# Patient Record
Sex: Male | Born: 1995 | Race: Black or African American | Hispanic: No | Marital: Single | Smoking: Never smoker
Health system: Southern US, Community
[De-identification: ages and names within clinical notes are randomized; demographics above are authoritative.]

## PROBLEM LIST (undated history)

## (undated) DIAGNOSIS — J45909 Unspecified asthma, uncomplicated: Secondary | ICD-10-CM

## (undated) DIAGNOSIS — R6250 Unspecified lack of expected normal physiological development in childhood: Secondary | ICD-10-CM

## (undated) DIAGNOSIS — Z9109 Other allergy status, other than to drugs and biological substances: Secondary | ICD-10-CM

## (undated) DIAGNOSIS — H9190 Unspecified hearing loss, unspecified ear: Secondary | ICD-10-CM

## (undated) DIAGNOSIS — R569 Unspecified convulsions: Secondary | ICD-10-CM

## (undated) HISTORY — PX: OTHER SURGICAL HISTORY: SHX169

## (undated) HISTORY — DX: Unspecified convulsions: R56.9

## (undated) HISTORY — PX: MOUTH SURGERY: SHX715

---
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
## 2018-05-30 HISTORY — PX: COCHLEAR IMPLANT: SUR684

## 2019-04-24 ENCOUNTER — Other Ambulatory Visit: Payer: Self-pay

## 2019-04-24 DIAGNOSIS — Z20822 Contact with and (suspected) exposure to covid-19: Secondary | ICD-10-CM

## 2019-04-26 ENCOUNTER — Telehealth: Payer: Self-pay | Admitting: General Practice

## 2019-04-26 LAB — NOVEL CORONAVIRUS, NAA: SARS-CoV-2, NAA: NOT DETECTED

## 2019-04-26 NOTE — Telephone Encounter (Signed)
Gave patient negative covid test results Patient understood 

## 2019-08-06 ENCOUNTER — Ambulatory Visit (INDEPENDENT_AMBULATORY_CARE_PROVIDER_SITE_OTHER): Payer: Self-pay

## 2019-08-06 ENCOUNTER — Encounter (HOSPITAL_COMMUNITY): Payer: Self-pay

## 2019-08-06 ENCOUNTER — Ambulatory Visit (HOSPITAL_COMMUNITY)
Admission: EM | Admit: 2019-08-06 | Discharge: 2019-08-06 | Disposition: A | Discharge status: Home / Self Care | Payer: Self-pay | Attending: Physician Assistant | Admitting: Physician Assistant

## 2019-08-06 ENCOUNTER — Other Ambulatory Visit: Payer: Self-pay

## 2019-08-06 DIAGNOSIS — R2689 Other abnormalities of gait and mobility: Secondary | ICD-10-CM

## 2019-08-06 DIAGNOSIS — M79672 Pain in left foot: Secondary | ICD-10-CM

## 2019-08-06 MED ORDER — NAPROXEN 500 MG PO TABS: 500.0000 mg | ORAL_TABLET | Freq: Two times a day (BID) | ORAL | 0 refills | Status: DC

## 2019-08-06 NOTE — ED Triage Notes (Signed)
Pt's mother states pt has been dragging feet; walking differently; and demonstrated that he did not want top of left foot touched. Pt non-verbal. Mom denies any known injury to area; states "he has fluid on both his feet and legs".

## 2019-08-06 NOTE — ED Provider Notes (Addendum)
[facility]    CSN: [id number] Arrival date & time: 08/06/19  1696      History   Chief Complaint Chief Complaint  Patient presents with  . Foot Pain    HPI Ruben Murphy is a 24 y.o. male.   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  Patient is nonverbal and has minimal understanding of sign language.  Mom reports that the way Ruben Murphy signals pain is that he will point to what hurts.  Mom reports he has had swelling in both of his ankles for over 1 month.  She reports that this is also been present in the past and gets better when he is more active.  She denies that this is been getting worse.  She reports over the last 3 to 4 days she has noticed that [facility] has began shuffling his left foot.  Mom states to her knowledge there has been no injury or falls.  She reports otherwise he has been his normal self.  Denies fever and chills.  Mom denies that 2 months he has signaled for pain elsewhere to include hip or knee.   Chart review shows that patient had swelling in his ankles in 2014.  Mom also believes that he had had ankle pain at that time.  He had imaging of the left foot done as well without significant findings.     Past Medical History:  Diagnosis Date  . Asthma   . Deaf   . Developmental delay   . Environmental allergies   . Seizures [facility])     Patient Active Problem List   Diagnosis Date Noted  . Generalized nonconvulsive epilepsy without mention of intractable epilepsy 01/04/2013  . Transient alteration of awareness 01/04/2013  . Morbid obesity (HCC) 01/04/2013  . 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  . Delayed milestones 01/04/2013  . Other specified forms of hearing loss 01/04/2013  . Microcephalus (HCC) 01/04/2013  . Tics of organic origin 01/04/2013    . 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 Congenital syphilis, unspecified 01/04/2013  . 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 Generalized convulsive epilepsy without mention of intractable epilepsy 01/04/2013  . Localization-related (focal) (partial) epilepsy and epileptic syndromes with complex partial seizures, without mention of intractable epilepsy 01/04/2013    Past Surgical History:  Procedure Laterality Date  . MOUTH SURGERY    . tubes in ears         Home Medications    Prior to Admission medications   Medication Sig Start Date End Date Taking? Authorizing Provider  albuterol (PROVENTIL HFA;VENTOLIN HFA) 108 (90 BASE) MCG/ACT inhaler Inhale 2 puffs into the lungs every 6 (six) hours as needed. For shortness of breath    [provider]  cetirizine (ZYRTEC) 10 MG tablet Take 10 mg by mouth at bedtime.    [provider]  Fluticasone-Salmeterol (ADVAIR) 250-50 MCG/DOSE AEPB Inhale 1 puff into the lungs every 12 (twelve) hours.    [provider]  Montelukast Sodium (SINGULAIR PO) Take 1 tablet by mouth daily.    [provider]  naproxen (NAPROSYN) 500 MG tablet Take 1 tablet (500 mg total) by mouth 2 (two) times daily. 08/06/19   Bennye Nix, Veryl Speak, PA-C    Family History History reviewed. No pertinent family history.  Social History Social History   Tobacco Use  .  Smoking status: Never Smoker  Substance Use Topics  . Alcohol use: No  . Drug use: No     Allergies   Patient has no known allergies.   Review of Systems Review of Systems  Constitutional: Negative for activity change, appetite change, chills, fatigue and fever.  Musculoskeletal: Positive for gait problem and joint swelling. Negative for myalgias.  Skin: Negative for color change and rash.  Neurological: Positive for speech difficulty.     Physical Exam Triage Vital Signs ED Triage Vitals [08/06/19 1013]  Enc Vitals Group     BP 124/79      Pulse Rate 74     Resp 18     Temp 97.9 F (36.6 C)     Temp src      SpO2 100 %     Weight      Height      Head Circumference      Peak Flow      Pain Score      Pain Loc      Pain Edu?      Excl. in [place]?    No data found.  Updated Vital Signs BP 124/79 (BP Location: Left Arm)   Pulse 74   Temp 97.9 F (36.6 C)   Resp 18   SpO2 100%   Visual Acuity Right Eye Distance:   Left Eye Distance:   Bilateral Distance:    Right Eye Near:   Left Eye Near:    Bilateral Near:     Physical Exam Vitals and nursing note reviewed.  Constitutional:      Appearance: He is well-developed. He is obese.     Comments: Patient is nonverbal and congenitally deaf.  Able to understand basic written language and basic signs.  He follows basic hand signal commands  HENT:     Head: Normocephalic and atraumatic.  Eyes:     Extraocular Movements: Extraocular movements intact.     Conjunctiva/sclera: Conjunctivae normal.     Pupils: Pupils are equal, round, and reactive to light.     Comments: Tracking well across the room  Cardiovascular:     Rate and Rhythm: Normal rate and regular rhythm.     Heart sounds: No murmur.  Pulmonary:     Effort: Pulmonary effort is normal. No respiratory distress.     Breath sounds: Normal breath sounds.  Musculoskeletal:     Cervical back: Neck supple.     Comments: Bilateral ankle swelling that is not pitting.  Patient signals pain with manipulation of left ankle joint.  Signal for pain with palpation of the dorsum of the foot.  Joint is not erythematous and not warm.  Right ankle exam  without  pain signaled  Skin:    General: Skin is warm and dry.     Capillary Refill: Capillary refill takes less than 2 seconds.     Findings: No erythema or rash.  Neurological:     Mental Status: He is alert.     Comments: Patient ambulatory without issue.  There is dragging of the left foot with shuffling at times.  This is not present on every left stride.  No  issues with right sided gait.  Neurologic examination is very limited due to nonverbal status and severe developmental delays.  Overall however patient is moving all limbs well, is alert and interactive during exam.      UC Treatments / Results  Labs (all labs ordered are listed, but only abnormal results are  displayed) Labs Reviewed - No data to display  EKG   Radiology DG Foot Complete Left  Result Date: 08/06/2019 CLINICAL DATA:  Dorsal left foot pain with change in gait. EXAM: LEFT FOOT - COMPLETE 3+ VIEW COMPARISON:  11/14/2011 FINDINGS: There is no evidence of fracture or dislocation. There is suggestion of flatfoot deformity but this could be from obliquity as the patient is not visibly weight-bearing. Medial navicular protuberance without sclerosis. There is no evidence of arthropathy or other focal bone abnormality. Soft tissues are unremarkable. IMPRESSION: No acute finding. Electronically Signed   By: Marnee Spring M.D.   On: 08/06/2019 11:14    Procedures Procedures (including critical care time)  Medications Ordered in UC Medications - No data to display  Initial Impression / Assessment and Plan / UC Course  I have reviewed the triage vital signs and the nursing notes.  Pertinent labs & imaging results that were available during my care of the patient were reviewed by me and considered in my medical decision making (see chart for details).     #Left foot pain #Shuffling gait Patient is a 24 year old with congenital deafness with severe developmental delays secondary to congenital syphilis and congenital CMV infection.  X-ray of left foot without arthropathy or bony abnormality.   Patient appears neurologically well and has been at baseline mental status according to mom.  Limited neurologic exam due to developmental delay and limited communication.  Ambulating well with the exception of shuffling of the left foot at times.  Concern of possible foot drop as an  etiology.  This could also be a pain response.  The swelling in his ankles is not edema and is isolated to the ankles.  Patient had similar presentations in 2013 and 2014 when chart review with a benign work-up at that time.  This was attributed to body habitus at the time.  Given chronicity and mostly benign exam today there is no acute indication for more work-up at this time.  Discussed with mom that follow-up with orthopedics is advised and supplied her with options for primary care in the area.  Discussed that if gait becomes worse or he has changes in mental status that he should be taken to the emergency department.  -Mom states understanding and agrees with the plan will follow up accordingly -Instructed mom that naproxen 2 times a day would be fine.  Final Clinical Impressions(s) / UC Diagnoses   Final diagnoses:  Shuffling gait  Left foot pain     Discharge Instructions     The x-ray of Ruben Murphy's foot was negative for any fractures or abnormalities.  It does appear that he has flat feet though.  I do not have a great explanation for his change in gait at this time.  I would like for you to follow-up with the orthopedic office supplied.  If you notice significant changes in his gait continuing, worsening pain, redness or worsening swelling of the ankle or change in his mental status or becomes more sleepy I would like for you to take him to the emergency department for evaluation.  You may continue ibuprofen or naproxen.      ED Prescriptions    Medication Sig Dispense Auth. Provider   naproxen (NAPROSYN) 500 MG tablet Take 1 tablet (500 mg total) by mouth 2 (two) times daily. 30 tablet Glyn Gerads, Veryl Speak, PA-C     PDMP not reviewed this encounter.   Hermelinda Medicus, PA-C 08/06/19 1144    Jermika Olden, Veryl Speak,  PA-C 08/06/19 1146

## 2019-08-06 NOTE — Discharge Instructions (Addendum)
The x-ray of Ruben Murphy's foot was negative for any fractures or abnormalities.  It does appear that he has flat feet though.  I do not have a great explanation for his change in gait at this time.  I would like for you to follow-up with the orthopedic office supplied.  If you notice significant changes in his gait continuing, worsening pain, redness or worsening swelling of the ankle or change in his mental status or becomes more sleepy I would like for you to take him to the emergency department for evaluation.  You may continue ibuprofen or naproxen.

## 2020-01-28 ENCOUNTER — Other Ambulatory Visit: Payer: Self-pay

## 2020-01-28 ENCOUNTER — Ambulatory Visit (HOSPITAL_COMMUNITY)
Admission: EM | Admit: 2020-01-28 | Discharge: 2020-01-28 | Disposition: A | Discharge status: Home / Self Care | Payer: Medicaid Other | Attending: Family Medicine | Admitting: Family Medicine

## 2020-01-28 ENCOUNTER — Encounter (HOSPITAL_COMMUNITY): Payer: Self-pay

## 2020-01-28 DIAGNOSIS — R630 Anorexia: Secondary | ICD-10-CM

## 2020-01-28 LAB — POCT URINALYSIS DIPSTICK, ED / UC
Glucose, UA: NEGATIVE mg/dL
Hgb urine dipstick: NEGATIVE
Ketones, ur: NEGATIVE mg/dL
Leukocytes,Ua: NEGATIVE
Nitrite: NEGATIVE
Protein, ur: 30 mg/dL — AB
Specific Gravity, Urine: 1.025 (ref 1.005–1.030)
Urobilinogen, UA: 4 mg/dL — ABNORMAL HIGH (ref 0.0–1.0)
pH: 7 (ref 5.0–8.0)

## 2020-01-28 NOTE — ED Provider Notes (Signed)
[facility]    CSN: [id number] Arrival date & time: 01/28/20  1319      History   Chief Complaint Chief Complaint  Patient presents with  . LOOS OF APPETITE    HPI Ruben Murphy is a 24 y.o. male.   Here today with mom who provides all history as patient is deaf and has developmental delays that prohibit him from providing history independently. About 2 weeks of decreased appetite, decreased BMs and the past 2 days mom noticed him clutching the right side of his abdomen here and there. COVID test initially when sxs began were negative. Denies fever, sweats, vomiting, sick contacts, rashes, new medications or supplements. Mother states she is working on getting a primary care provider established in the area for ongoing management as they have been without one for over a year since moving here.      Past Medical History:  Diagnosis Date  . Asthma   . Deaf   . Developmental delay   . Environmental allergies   . Seizures [facility])     Patient Active Problem List   Diagnosis Date Noted  . Generalized nonconvulsive epilepsy without mention of intractable epilepsy 01/04/2013  . Transient alteration of awareness 01/04/2013  . Morbid obesity (HCC) 01/04/2013  . 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  . Delayed milestones 01/04/2013  . Other specified forms of hearing loss 01/04/2013  . Microcephalus (HCC) 01/04/2013  . Tics of organic origin 01/04/2013  . 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 Congenital syphilis, unspecified 01/04/2013  . 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 Generalized convulsive epilepsy without mention of intractable epilepsy 01/04/2013  . Localization-related (focal) (partial) epilepsy and epileptic syndromes with complex partial seizures, without mention of intractable epilepsy 01/04/2013    Past Surgical History:  Procedure Laterality Date  . MOUTH SURGERY    . tubes in ears         Home Medications    Prior to  Admission medications   Medication Sig Start Date End Date Taking? Authorizing Provider  albuterol (PROVENTIL HFA;VENTOLIN HFA) 108 (90 BASE) MCG/ACT inhaler Inhale 2 puffs into the lungs every 6 (six) hours as needed. For shortness of breath    [provider]  cetirizine (ZYRTEC) 10 MG tablet Take 10 mg by mouth at bedtime.    [provider]  Fluticasone-Salmeterol (ADVAIR) 250-50 MCG/DOSE AEPB Inhale 1 puff into the lungs every 12 (twelve) hours.    [provider]  Montelukast Sodium (SINGULAIR PO) Take 1 tablet by mouth daily.    [provider]  naproxen (NAPROSYN) 500 MG tablet Take 1 tablet (500 mg total) by mouth 2 (two) times daily. 08/06/19   Darr, Veryl Speak, PA-C    Family History Family History  Problem Relation Age of Onset  . Hypertension Mother   . Obesity Mother   . Healthy Father     Social History Social History   Tobacco Use  . Smoking status: Never Smoker  . Smokeless tobacco: Never Used  Substance Use Topics  . Alcohol use: No  . Drug use: No     Allergies   Patient has no known allergies.   Review of Systems Review of Systems  Constitutional: Positive for appetite change.  HENT: Negative.   Eyes: Negative.   Respiratory: Negative.   Cardiovascular: Negative.   Gastrointestinal: Positive for abdominal pain.  Genitourinary: Negative.   Musculoskeletal: Negative.   Skin: Negative.   Neurological: Negative.   Psychiatric/Behavioral: Negative.      Physical  Exam Triage Vital Signs ED Triage Vitals  Enc Vitals Group     BP 01/28/20 1521 120/73     Pulse Rate 01/28/20 1521 76     Resp 01/28/20 1521 18     Temp 01/28/20 1521 98 F (36.7 C)     Temp Source 01/28/20 1521 Oral     SpO2 01/28/20 1521 100 %     Weight --      Height --      Head Circumference --      Peak Flow --      Pain Score 01/28/20 1517 0     Pain Loc --      Pain Edu? --      Excl. in [place]? --    No data found.  Updated Vital  Signs BP 120/73 (BP Location: Right Arm)   Pulse 76   Temp 98 F (36.7 C) (Oral)   Resp 18   SpO2 100%   Visual Acuity Right Eye Distance:   Left Eye Distance:   Bilateral Distance:    Right Eye Near:   Left Eye Near:    Bilateral Near:     Physical Exam Vitals and nursing note reviewed.  Constitutional:      General: He is not in acute distress.    Appearance: Normal appearance.  HENT:     Head: Atraumatic.     Mouth/Throat:     Mouth: Mucous membranes are moist.     Pharynx: Oropharynx is clear.     Comments: Poor dentition noted but otherwise no abnormalities in the mouth Eyes:     Extraocular Movements: Extraocular movements intact.     Conjunctiva/sclera: Conjunctivae normal.  Cardiovascular:     Rate and Rhythm: Normal rate and regular rhythm.  Pulmonary:     Effort: Pulmonary effort is normal. No respiratory distress.     Breath sounds: Normal breath sounds.  Abdominal:     General: Bowel sounds are normal. There is no distension.     Palpations: Abdomen is soft.     Tenderness: There is no abdominal tenderness. There is no right CVA tenderness, left CVA tenderness or guarding.  Musculoskeletal:        General: Normal range of motion.     Cervical back: Normal range of motion and neck supple.  Skin:    General: Skin is warm and dry.  Neurological:     Mental Status: He is alert.     Motor: No weakness.     Gait: Gait normal.     Comments: Non-verbal at baseline  Psychiatric:        Mood and Affect: Mood normal.        Thought Content: Thought content normal.        Judgment: Judgment normal.      UC Treatments / Results  Labs (all labs ordered are listed, but only abnormal results are displayed) Labs Reviewed  POCT URINALYSIS DIPSTICK, ED / UC - Abnormal; Notable for the following components:      Result Value   Bilirubin Urine SMALL (*)    Protein, ur 30 (*)    Urobilinogen, UA 4.0 (*)    All other components within normal limits     EKG   Radiology No results found.  Procedures Procedures (including critical care time)  Medications Ordered in UC Medications - No data to display  Initial Impression / Assessment and Plan / UC Course  I have reviewed the triage vital signs and  the nursing notes.  Pertinent labs & imaging results that were available during my care of the patient were reviewed by me and considered in my medical decision making (see chart for details).     U/A benign, vitals and exam reassuring and WNL. Continue to monitor sxs, push nutrition dense food as tolerated and keep well hydrated. Can supplement with boost drinks or other nutritional supplements. Follow up as scheduled with new primary care provider. Return precautions reviewed.    Final Clinical Impressions(s) / UC Diagnoses   Final diagnoses:  Decreased appetite     Discharge Instructions     Can supplement diet with boost or protein shakes. Work on nutrition dense diet and staying hydrated    ED Prescriptions    None     PDMP not reviewed this encounter.   Particia Nearing, [place] 01/28/20 1625

## 2020-01-28 NOTE — Discharge Instructions (Addendum)
Can supplement diet with boost or protein shakes. Work on nutrition dense diet and staying hydrated

## 2020-01-28 NOTE — ED Triage Notes (Addendum)
Pt is here with loss of appetite that started 2 weeks ago after getting his COVID vaccine, pt has taken Tylenol to relieve discomfort.

## 2020-02-27 ENCOUNTER — Other Ambulatory Visit: Payer: Self-pay

## 2020-02-27 ENCOUNTER — Encounter: Payer: Self-pay | Admitting: Family Medicine

## 2020-02-27 ENCOUNTER — Ambulatory Visit (INDEPENDENT_AMBULATORY_CARE_PROVIDER_SITE_OTHER): Payer: Medicaid Other | Admitting: Family Medicine

## 2020-02-27 VITALS — BP 118/64 | HR 81 | Ht 65.0 in | Wt 194.0 lb

## 2020-02-27 DIAGNOSIS — Z0000 Encounter for general adult medical examination without abnormal findings: Secondary | ICD-10-CM | POA: Diagnosis not present

## 2020-02-27 DIAGNOSIS — Z9621 Cochlear implant status: Secondary | ICD-10-CM | POA: Diagnosis not present

## 2020-02-27 DIAGNOSIS — R269 Unspecified abnormalities of gait and mobility: Secondary | ICD-10-CM

## 2020-02-27 DIAGNOSIS — G808 Other cerebral palsy: Secondary | ICD-10-CM | POA: Diagnosis not present

## 2020-02-27 DIAGNOSIS — J309 Allergic rhinitis, unspecified: Secondary | ICD-10-CM | POA: Diagnosis not present

## 2020-02-27 DIAGNOSIS — J4520 Mild intermittent asthma, uncomplicated: Secondary | ICD-10-CM

## 2020-02-27 DIAGNOSIS — H903 Sensorineural hearing loss, bilateral: Secondary | ICD-10-CM

## 2020-02-27 DIAGNOSIS — Z23 Encounter for immunization: Secondary | ICD-10-CM

## 2020-02-27 MED ORDER — ALBUTEROL SULFATE HFA 108 (90 BASE) MCG/ACT IN AERS: 2.0000 | INHALATION_SPRAY | Freq: Four times a day (QID) | RESPIRATORY_TRACT | 2 refills | Status: DC | PRN

## 2020-02-27 MED ORDER — CETIRIZINE HCL 10 MG PO TABS: 10.0000 mg | ORAL_TABLET | Freq: Every day | ORAL | 2 refills | Status: DC

## 2020-02-27 MED ORDER — MONTELUKAST SODIUM 10 MG PO TABS: 10.0000 mg | ORAL_TABLET | Freq: Every day | ORAL | 2 refills | Status: DC

## 2020-02-27 MED ORDER — FLUTICASONE-SALMETEROL 250-50 MCG/DOSE IN AEPB: 1.0000 | INHALATION_SPRAY | Freq: Two times a day (BID) | RESPIRATORY_TRACT | 2 refills | Status: DC

## 2020-02-27 NOTE — Progress Notes (Signed)
    SUBJECTIVE:   CHIEF COMPLAINT / HPI: Establish care  Abnormal gait Mother reports issues with his gait for the past 6 to 7 months where he will occasionally stop moving one of his legs.  Has had 1 fall, which mother states was not serious.  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  Had cochlear implants in February 2020 in [place], but was unfortunately unable to have them adjusted due to the pandemic.  Patient was having some pain with the device, so he has not been using it.  Mother requesting referral to have cochlear implants adjusted here.  History of seizures History of seizures as a young child, no longer having seizures.  PERTINENT  PMH / PSH: Seizures, congenital diplegia, periventricular leukomalacia, bilateral sensory hearing loss, developmental delay, microcephalus, congenital syphilis, congenital CMV infection, asthma, allergic rhinitis  OBJECTIVE:   BP 118/64   Pulse 81   Ht 5\' 5"  (1.651 m)   Wt 194 lb (88 kg)   SpO2 97%   BMI 32.28 kg/m   General: Obese young male, NAD, non-verbal at baseline HEENT: PERRL, EOMI, MMM, poor dentition, TMs with small amount of non-impacted cerumen Neck: no LAD CV: RRR, no murmurs Pulm: CTAB, no wheezes or rales Abd: soft, non-tender, +BS  ASSESSMENT/PLAN:   Congenital diplegia With new gait abnormality over the past several months.  Can represent a variety of etiologies, such as Parkinson disease, NPH, behavioral issues.  Has had 1 apparently nonsignificant fall. - referral to neurology  Sensory hearing loss, bilateral With history of congenital CMV infection.  Is status post cochlear implants in 2020, but was unable to get them adjusted due to the pandemic.  Not currently using them. - referral to ENT  Asthma No history of hospitalizations. Has not had any of his medications since moving here. Only occasionally needing albuterol. - refilled albuterol, Advair, montelukast - can consider stepdown  therapy in the future  Health maintenance - flu shot given today - reports UTD on hep C screening, HIV screening, and tetanus - will obtain records - encouraged physical activity and reduce video games  F/u 3 months to review prior records and obtain labs  2021, MD [facility]

## 2020-02-27 NOTE — Patient Instructions (Addendum)
It was nice seeing you today, Bird.  For the cochlear implants, I have placed a referral for ENT.  For the gait issues, I have placed a referral for neurology as well as physical therapy.  I recommend encouraging more physical activity and less video games.  I think by exercising more, it will help with his gait.  Please return in 3 months.  I would like to review his previous records and do some lab work as well.  Stay well, Littie Deeds, MD

## 2020-02-27 NOTE — Assessment & Plan Note (Signed)
With history of congenital CMV infection.  Is status post cochlear implants in 2020, but was unable to get them adjusted due to the pandemic.  Not currently using them. - referral to ENT

## 2020-02-27 NOTE — Assessment & Plan Note (Signed)
With new gait abnormality over the past several months.  Can represent a variety of etiologies, such as Parkinson disease, NPH, behavioral issues.  Has had 1 apparently nonsignificant fall. - referral to neurology

## 2020-03-04 ENCOUNTER — Encounter: Payer: Self-pay | Admitting: Neurology

## 2020-05-04 DIAGNOSIS — Z9621 Cochlear implant status: Secondary | ICD-10-CM | POA: Diagnosis not present

## 2020-05-04 DIAGNOSIS — H903 Sensorineural hearing loss, bilateral: Secondary | ICD-10-CM | POA: Diagnosis not present

## 2020-05-04 DIAGNOSIS — Z4889 Encounter for other specified surgical aftercare: Secondary | ICD-10-CM | POA: Diagnosis not present

## 2020-06-10 ENCOUNTER — Other Ambulatory Visit: Payer: Self-pay

## 2020-06-10 ENCOUNTER — Ambulatory Visit: Payer: Medicaid Other | Admitting: Neurology

## 2020-06-10 ENCOUNTER — Encounter: Payer: Self-pay | Admitting: Neurology

## 2020-06-10 VITALS — BP 98/68 | HR 60 | Ht 65.0 in | Wt 196.2 lb

## 2020-06-10 DIAGNOSIS — Z87728 Personal history of other specified (corrected) congenital malformations of nervous system and sense organs: Secondary | ICD-10-CM

## 2020-06-10 DIAGNOSIS — R269 Unspecified abnormalities of gait and mobility: Secondary | ICD-10-CM

## 2020-06-10 NOTE — Progress Notes (Signed)
NEUROLOGY CONSULTATION NOTE  Ruben Murphy MRN: [id number] DOB: Feb 05, 1996  Referring provider: Dr. Terisa Starr Primary care provider: Dr. Littie Deeds  Reason for consult:  Gait disorder and history of seizure  Dear Dr Manson Passey:  Thank you for your kind referral of Ruben Murphy for consultation of the above symptoms. Although his history is well known to you, please allow me to reiterate it for the purpose of our medical record. The patient was accompanied to the clinic by his mother Ruben Murphy who provides collateral information. Records and images were personally reviewed where available.   HISTORY OF PRESENT ILLNESS: This is a 25 year old man with a history of congenital CMV infection, congenital syphilis, hearing loss s/p cochlear implants in 06/2018, developmental delay, seizures in childhood, presenting for evaluation of abnormal gait. His mother reported gradual gait changes that started summer 2021. He fell while walking in the hall, he did not trip over anything. She noticed that he would do a "stutter step," he would hesitate to move then make sure he is balanced before he took a step. She noticed the fingers on his right hand would we fluttering before he takes the step. She does not think he is dragging one left more than the other, no change from baseline gait. He has had one more fall since then going up the steps in November. No bowel/bladder incontinence, he can use the bathroom by himself. His mother has not noticed any issues with his upper extremities. He was in the ER in March 2021 for changes in gait, foot pain. At that time he had bilateral ankle swelling, and his mother noted shuffling of left foot. He was noted to have occasional shuffling of left foot on their exam, felt possibly a pain response. He has not complained of pain recently. She notes that at the same time last year after the fall, he was barely eating and lost weight. She brought him to the ER with  unremarkable workup, this lasted for a month then his appetite gradually started picking back up. His grandmother has raised concern about Parkinson's disease because his grandfather and other people they know have it. Sleep is good. No significant behavioral changes. He has not had any seizures in many years. He has had only 1 convulsion at age 25 or 8. His seizures after were staring seizures, last was at age 25. He is not on any seizure medication.   MRI brain in 2002 (images unavailable for review) reported no acute changes, there was increased FLAIR signal in the periventricular and subcortical white matter consistent with congenital CMV infection.    PAST MEDICAL HISTORY: Past Medical History:  Diagnosis Date  . Asthma   . Deaf   . Developmental delay   . Environmental allergies   . Seizures (HCC)     PAST SURGICAL HISTORY: Past Surgical History:  Procedure Laterality Date  . COCHLEAR IMPLANT  2020  . MOUTH SURGERY    . tubes in ears      MEDICATIONS: Current Outpatient Medications on File Prior to Visit  Medication Sig Dispense Refill  . albuterol (VENTOLIN HFA) 108 (90 Base) MCG/ACT inhaler Inhale 2 puffs into the lungs every 6 (six) hours as needed. For shortness of breath 18 g 2  . cetirizine (ZYRTEC) 10 MG tablet Take 1 tablet (10 mg total) by mouth at bedtime. 30 tablet 2  . Fluticasone-Salmeterol (ADVAIR) 250-50 MCG/DOSE AEPB Inhale 1 puff into the lungs every 12 (twelve) hours. 60 each 2  .  montelukast (SINGULAIR) 10 MG tablet Take 1 tablet (10 mg total) by mouth daily. 30 tablet 2   No current facility-administered medications on file prior to visit.    ALLERGIES: No Known Allergies  FAMILY HISTORY: Family History  Problem Relation Age of Onset  . Hypertension Mother   . Obesity Mother   . Healthy Father     SOCIAL HISTORY: Social History   Socioeconomic History  . Marital status: Single    Spouse name: Not on file  . Number of children: Not on file   . Years of education: Not on file  . Highest education level: Not on file  Occupational History  . Not on file  Tobacco Use  . Smoking status: Never Smoker  . Smokeless tobacco: Never Used  Substance and Sexual Activity  . Alcohol use: No  . Drug use: No  . Sexual activity: Never  Other Topics Concern  . Not on file  Social History Narrative   Right handed   Social Determinants of Health   Financial Resource Strain: Not on file  Food Insecurity: Not on file  Transportation Needs: Not on file  Physical Activity: Not on file  Stress: Not on file  Social Connections: Not on file  Intimate Partner Violence: Not on file     PHYSICAL EXAM: Vitals:   06/10/20 1050  BP: 98/68  Pulse: 60  SpO2: 98%   General: No acute distress Head:  Normocephalic/atraumatic Skin/Extremities: No rash, no edema Neurological Exam: Mental status: alert and awake. He vocalizes several times but is unable to produce any words, smiles, waves. He is able to type/text on his mother's cell phone. Able to follow simple commands.  Cranial nerves: CN I: not tested CN II: pupils equal, round and reactive to light, +blink to visual threat on both sides CN III, IV, VI:  full range of motion, no nystagmus, no ptosis CN VII: upper and lower face symmetric Bulk & Tone: normal, no fasciculations, no cogwheeling Motor: 5/5 throughout with no pronator drift. Sensation: unable to test, withdraws to touch Deep Tendon Reflexes: +2 throughout Cerebellar: no incoordination on finger to nose testing Gait: slightly wide-based, alternating foot clearance, at times he does not lift the right foot as much, other times the left. No localizing signs Tremor: none.   IMPRESSION: This is a 25 year old man with a history of congenital CMV infection, congenital syphilis, hearing loss s/p cochlear implants in 06/2018, developmental delay, seizures in childhood, presenting for evaluation of abnormal gait that his mother  reports started gradually almost a year ago. She mostly notes what appears to be hesitate before he takes a step but has not noticed significant gait changes. He has decreased foot clearance of either foot today, not localizing. Discussed MRI brain in 2002 had shown white matter changes in the periventricular and subcortical white matter, and with age-related white matter changes, gait speed and step length can change. Head CT without contrast will be ordered to assess for underlying structural abnormality. He will be referred to Physical Therapy for gait/balance therapy. He has been seizure-free off medication since age 30. Follow-up as needed, his mother knows to call for any changes.   Thank you for allowing me to participate in the care of this patient. Please do not hesitate to call for any questions or concerns.   Ruben Murphy, M.D.  CC: Dr. Manson Passey, Dr. Wynelle Link

## 2020-06-10 NOTE — Patient Instructions (Addendum)
Good to meet you!  1. Schedule head CT without contrast. We have sent a referral to [facility] for your MRI and they will call you directly to schedule your appointment. They are located at [address] [facility]. If you need to contact them directly please call [phone].   2. Refer to Physical Therapy for gait imbalance  3. Follow-up as needed, call for any changes

## 2020-06-24 ENCOUNTER — Ambulatory Visit
Admission: RE | Admit: 2020-06-24 | Discharge: 2020-06-24 | Disposition: A | Discharge status: Home / Self Care | Payer: Medicaid Other | Source: Ambulatory Visit | Attending: Neurology | Admitting: Neurology

## 2020-06-24 ENCOUNTER — Other Ambulatory Visit: Payer: Self-pay

## 2020-06-24 DIAGNOSIS — Z87728 Personal history of other specified (corrected) congenital malformations of nervous system and sense organs: Secondary | ICD-10-CM

## 2020-06-24 DIAGNOSIS — R569 Unspecified convulsions: Secondary | ICD-10-CM | POA: Diagnosis not present

## 2020-06-25 ENCOUNTER — Telehealth: Payer: Self-pay

## 2020-06-25 NOTE — Telephone Encounter (Signed)
-----   Message from Van Clines, MD sent at 06/25/2020 10:15 AM EST ----- Pls let mother know the head CT looked fine, no new changes, no tumor, stroke, or bleed seen. Thanks

## 2020-06-25 NOTE — Telephone Encounter (Signed)
Pt mother called no answer left a voice mail to call the office back  

## 2020-06-26 ENCOUNTER — Telehealth: Payer: Self-pay

## 2020-06-26 NOTE — Telephone Encounter (Signed)
Spoke with pt mother informed her that head CT looked fine, no new changes, no tumor, stroke, or bleed seen

## 2020-06-26 NOTE — Telephone Encounter (Signed)
-----   Message from Karen M Aquino, MD sent at 06/25/2020 10:15 AM EST ----- Pls let mother know the head CT looked fine, no new changes, no tumor, stroke, or bleed seen. Thanks 

## 2020-09-01 ENCOUNTER — Other Ambulatory Visit: Payer: Self-pay

## 2020-09-01 ENCOUNTER — Encounter (HOSPITAL_COMMUNITY): Payer: Self-pay

## 2020-09-01 ENCOUNTER — Emergency Department (HOSPITAL_COMMUNITY)
Admission: EM | Admit: 2020-09-01 | Discharge: 2020-09-01 | Disposition: A | Discharge status: Home / Self Care | Payer: Medicaid Other | Attending: Emergency Medicine | Admitting: Emergency Medicine

## 2020-09-01 DIAGNOSIS — R2689 Other abnormalities of gait and mobility: Secondary | ICD-10-CM | POA: Diagnosis not present

## 2020-09-01 DIAGNOSIS — Z7951 Long term (current) use of inhaled steroids: Secondary | ICD-10-CM | POA: Diagnosis not present

## 2020-09-01 DIAGNOSIS — J45909 Unspecified asthma, uncomplicated: Secondary | ICD-10-CM | POA: Diagnosis not present

## 2020-09-01 DIAGNOSIS — R269 Unspecified abnormalities of gait and mobility: Secondary | ICD-10-CM

## 2020-09-01 NOTE — ED Triage Notes (Signed)
Emergency Medicine Provider Triage Evaluation Note  Ruben Murphy , a 25 y.o. male  was evaluated in triage.  Pt complains of ambulation difficulties x2 days. Mother is at bedside and provided history. Mother states patient has had a slow gait and has been walking hunched over holding his right arm. She notes a few falls over the past few days. Ambulates normally at baseline. No changes from baseline except decreased po intake. Per mother, patient had a recent CT scan which was unremarkable.    Physical Exam  BP (!) 138/102 (BP Location: Left Arm)   Pulse 93   Resp 20   SpO2 100%  Gen:   Awake, no distress   HEENT:  Atraumatic  Resp:  Normal effort  Cardiac:  Normal rate  Abd:   Nondistended, nontender  MSK:   Moves extremities without difficulty  Neuro:  Speech clear   Medical Decision Making  Medically screening exam initiated at 5:09 PM.  Appropriate orders placed.  Ruben Murphy was informed that the remainder of the evaluation will be completed by another provider, this initial triage assessment does not replace that evaluation, and the importance of remaining in the ED until their evaluation is complete.  Clinical Impression  Ambulation difficulties. Recent unremarkable CT head per mother.    Ruben Murphy, [place] 09/01/20 1711

## 2020-09-01 NOTE — ED Triage Notes (Signed)
Family member reports that patient has had difficulty ambulating x 2 days, with the difficulty walking has had fall that they relate to the ambulation problem. Patient moving all extremities. At normal baseline.

## 2020-09-01 NOTE — Discharge Instructions (Addendum)
Follow-up for physical therapy as we discussed

## 2020-09-01 NOTE — ED Provider Notes (Signed)
[facility] EMERGENCY DEPARTMENT Provider Note   CSN: [id number] Arrival date & time: 09/01/20  1548     History No chief complaint on file.   Ruben Murphy is a 25 y.o. male.  25 year old male with history of developmental delay as well as deafness presents due to concern for change in gait.  Review of patient's old record shows that he was seen by neurology for this 3 months ago for similar symptoms.  Work-up at that time was negative including head CT.  It was recommended that he undergo physical therapy which mother states he has been able to do.  Patient's mental status is at his baseline.  No recent illnesses.  Mother states that patient has trouble with walking occasionally almost falls down.  No reported history of head trauma.        Past Medical History:  Diagnosis Date  . Asthma   . Deaf   . Developmental delay   . Environmental allergies   . Seizures [facility]     Patient Active Problem List   Diagnosis Date Noted  . Allergic rhinitis 02/27/2020  . Asthma 05/01/2013  . Sensory hearing loss, 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  . Generalized nonconvulsive epilepsy without mention of intractable epilepsy 01/04/2013  . Transient alteration of awareness 01/04/2013  . Morbid obesity (HCC) 01/04/2013  . 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  . Delayed milestones 01/04/2013  . Other specified forms of hearing loss 01/04/2013  . Microcephalus (HCC) 01/04/2013  . Tics of organic origin 01/04/2013  . 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 Congenital syphilis 01/04/2013  . 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 Generalized convulsive epilepsy without mention of intractable epilepsy 01/04/2013  . Localization-related (focal) (partial) epilepsy and epileptic syndromes with complex partial seizures, without mention of intractable epilepsy 01/04/2013    Past Surgical History:  Procedure Laterality Date  . COCHLEAR IMPLANT  2020  . MOUTH SURGERY    .  tubes in ears         Family History  Problem Relation Age of Onset  . Hypertension Mother   . Obesity Mother   . Healthy Father     Social History   Tobacco Use  . Smoking status: Never Smoker  . Smokeless tobacco: Never Used  Substance Use Topics  . Alcohol use: No  . Drug use: No    Home Medications Prior to Admission medications   Medication Sig Start Date End Date Taking? Authorizing Provider  albuterol (VENTOLIN HFA) 108 (90 Base) MCG/ACT inhaler Inhale 2 puffs into the lungs every 6 (six) hours as needed. For shortness of breath 02/27/20   Littie Deeds, MD  cetirizine (ZYRTEC) 10 MG tablet Take 1 tablet (10 mg total) by mouth at bedtime. 02/27/20   Littie Deeds, MD  Fluticasone-Salmeterol (ADVAIR) 250-50 MCG/DOSE AEPB Inhale 1 puff into the lungs every 12 (twelve) hours. 02/27/20   Littie Deeds, MD  montelukast (SINGULAIR) 10 MG tablet Take 1 tablet (10 mg total) by mouth daily. 02/27/20   Littie Deeds, MD    Allergies    Patient has no known allergies.  Review of Systems   Review of Systems  All other systems reviewed and are negative.   Physical Exam Updated Vital Signs BP (!) 138/102 (BP Location: Left Arm)   Pulse 93   Temp (!) 97.4 F (36.3 C) (Oral)   Resp 20   SpO2 100%   Physical Exam Vitals and nursing note reviewed.  Constitutional:      General: He is not in  acute distress.    Appearance: Normal appearance. He is well-developed. He is not toxic-appearing.  HENT:     Head: Normocephalic and atraumatic.  Eyes:     General: Lids are normal.     Conjunctiva/sclera: Conjunctivae normal.     Pupils: Pupils are equal, round, and reactive to light.  Neck:     Thyroid: No thyroid mass.     Trachea: No tracheal deviation.  Cardiovascular:     Rate and Rhythm: Normal rate and regular rhythm.     Heart sounds: Normal heart sounds. No murmur heard. No gallop.   Pulmonary:     Effort: Pulmonary effort is normal. No respiratory distress.     Breath  sounds: Normal breath sounds. No stridor. No decreased breath sounds, wheezing, rhonchi or rales.  Abdominal:     General: Bowel sounds are normal. There is no distension.     Palpations: Abdomen is soft.     Tenderness: There is no abdominal tenderness. There is no rebound.  Musculoskeletal:        General: No tenderness. Normal range of motion.     Cervical back: Normal range of motion and neck supple.  Skin:    General: Skin is warm and dry.     Findings: No abrasion or rash.  Neurological:     General: No focal deficit present.     Mental Status: He is alert. Mental status is at baseline.     GCS: GCS eye subscore is 4. GCS verbal subscore is 5. GCS motor subscore is 6.     Cranial Nerves: Cranial nerves are intact. No cranial nerve deficit.     Motor: No tremor.     Gait: Tandem walk abnormal.  Psychiatric:        Attention and Perception: Attention normal.     ED Results / Procedures / Treatments   Labs (all labs ordered are listed, but only abnormal results are displayed) Labs Reviewed - No data to display  EKG None  Radiology No results found.  Procedures Procedures   Medications Ordered in ED Medications - No data to display  ED Course  I have reviewed the triage vital signs and the nursing notes.  Pertinent labs & imaging results that were available during my care of the patient were reviewed by me and considered in my medical decision making (see chart for details).    MDM Rules/Calculators/A&P                          I reviewed the patient's old record, his gait appears to be stable when compared to what was described 3 months ago.  I encouraged mother to have patient follow-up for physical therapy.  We discussed getting a head CT but patient appears to be at his baseline and we agreed that it would not be done at this time.  Return precautions given Final Clinical Impression(s) / ED Diagnoses Final diagnoses:  None    Rx / DC Orders ED Discharge  Orders    None       Lorre Nick, MD 09/01/20 1952

## 2020-09-08 ENCOUNTER — Other Ambulatory Visit: Payer: Self-pay

## 2020-09-08 ENCOUNTER — Ambulatory Visit (INDEPENDENT_AMBULATORY_CARE_PROVIDER_SITE_OTHER): Payer: Medicaid Other | Admitting: Student in an Organized Health Care Education/Training Program

## 2020-09-08 ENCOUNTER — Ambulatory Visit
Admission: RE | Admit: 2020-09-08 | Discharge: 2020-09-08 | Disposition: A | Discharge status: Home / Self Care | Payer: Medicaid Other | Source: Ambulatory Visit | Attending: Family Medicine | Admitting: Family Medicine

## 2020-09-08 VITALS — BP 120/78 | HR 104 | Ht 65.0 in | Wt 195.4 lb

## 2020-09-08 DIAGNOSIS — R269 Unspecified abnormalities of gait and mobility: Secondary | ICD-10-CM | POA: Diagnosis not present

## 2020-09-08 DIAGNOSIS — W19XXXD Unspecified fall, subsequent encounter: Secondary | ICD-10-CM

## 2020-09-08 NOTE — Patient Instructions (Signed)
It was a pleasure to see you today!  To summarize our discussion for this visit:  I am giving you the contact information here for the recent physical therapy referral. I would recommend following up with them.   [phone] ([facility])  We will get some xrays of his feet and ankles to start as he may have had some injury with his falls and it is difficult to assess on exam  Some additional health maintenance measures we should update are: Health Maintenance Due  Topic Date Due  . Hepatitis C Screening  Never done  . HPV VACCINES (1 - Male 2-dose series) Never done  . HIV Screening  Never done  . TETANUS/TDAP  Never done  . COVID-19 Vaccine (2 - Booster for Janssen series) 03/09/2020  .    Call the clinic at [phone] if your symptoms worsen or you have any concerns.   Thank you for allowing me to take part in your care,  Dr. Jamelle Rushing

## 2020-09-08 NOTE — Progress Notes (Signed)
   SUBJECTIVE:   CHIEF COMPLAINT / HPI: falls, abnormal gait  Abnormal gait-  Has been walking slower for several months. Seen in ED x2, by PCP, and by neurology for this problem. Workup included head CT from neurology which did not show contributing explanation. Mom states that since last visit he has had 3 witnessed minor falls including one last week that prompted the most recent ED visit where "they did nothing for him". Over the weekend, she took him to the beach and he was not participating in swimming or running like he normally would. He just kept pointing to his Left leg. She also noticed that he was walking with holding one of his ankles stiff instead of normal pointing with steps. She thinks it was swollen but the swelling has resolved and she can't remember which one it was. He is unable to express when he is in pain. He is non-verbal and deaf.  Mom describes that he has been walking hunched over last 2 weeks but has improved this past week.  Last time he had PT was >10 years ago.  OBJECTIVE:   BP 120/78   Pulse (!) 104   Ht 5\' 5"  (1.651 m)   Wt 195 lb 6.4 oz (88.6 kg)   SpO2 97%   BMI 32.52 kg/m   Physical Exam Vitals and nursing note reviewed. Exam conducted with a chaperone present.  Constitutional:      General: He is not in acute distress.    Appearance: He is obese. He is not ill-appearing, toxic-appearing or diaphoretic.  Musculoskeletal:     Right foot: Normal pulse.     Left foot: Normal pulse.     Comments: After pROM and palpating thoroughly throughout foot and ankle including lateral and medial malleoli, metatarsals, achilles, calcaneus, ATF ligament, etc- there was no indication on patient's face that he had pain (no grimace, withdrawal from pain or verbal indication). No asymmetry or swelling. Positive for pes planus and + babinski which mother states is normal. Did not observe any bruising, erythema or lesions   Neurological:     Mental Status: He is alert.      Gait: Gait abnormal (patient shuffles feet with walking but appears stable and shows no signs of favoring one leg over the other. ).    ASSESSMENT/PLAN:   Abnormal gait Patient has been seen in ED and by neurology. No significant acute contribution has been found yet. Patient has history of possibly increased abnormality over the weekend but there is no focal findings on exam today.  Patient is unable to express pain at baseline so have very low suspicion to get imaging and assess for occult fracture. Most likely, patient has poor ambulation from congenital conditions and could have had acute injury with falls. Suspicious hx for possible strain/prain of ankle but has subjectively improved from mother's description and nothing seen on exam but limited from patient non-verbal status and inability to fully test strength/special tests. - ordering complete xray of feet and ankles today - provided mom with the contact information for neuro rehab referral that was placed previously - discussed symptomatic relief of mild sprain including ice/elevation/rest. Could consider a lace up brace     , DO [facility]

## 2020-09-08 NOTE — Assessment & Plan Note (Addendum)
Patient has been seen in ED and by neurology. No significant acute contribution has been found yet. Patient has history of possibly increased abnormality over the weekend but there is no focal findings on exam today.  Patient is unable to express pain at baseline so have very low suspicion to get imaging and assess for occult fracture. Most likely, patient has poor ambulation from congenital conditions and could have had acute injury with falls. Suspicious hx for possible strain/prain of ankle but has subjectively improved from mother's description and nothing seen on exam but limited from patient non-verbal status and inability to fully test strength/special tests. - ordering complete xray of feet and ankles today - provided mom with the contact information for neuro rehab referral that was placed previously - discussed symptomatic relief of mild sprain including ice/elevation/rest. Could consider a lace up brace

## 2021-09-16 NOTE — Progress Notes (Addendum)
?  SUBJECTIVE:  ? ?CHIEF COMPLAINT / HPI:  ? ?Asthma exacerbation: For the last 3 days wheezing, shrotness of breath, watery eyes, no cough other than briefly one night when he was sleeping. He has used his inhaler 2 puffs every 4 hours other than when he is sleeping. Mom has kept him in the house and he has been fine but going outside he gets exacerbated by his allergies. He has been eating and drinking normally, still been able to sleep well. She notes that he has trouble using the Advair because he is unable to follow instructions well but does well with his other medications. Denies any fever.  ? ?Abdominal concerns/constipation: Mother notes that sometimes he presses on his right lower side of his abdomen but does not appear bothered by it. He is urinating and stooling normally and she does not know of any blood in either of those.  Another family member has noticed that he sometimes does not stool daily. ? ?PERTINENT  PMH / PSH: congenital CMV, congenital syphilis, hearing loss s/p cochlear implants, developmental delay, asthma. ? ?OBJECTIVE:  ?BP (!) 130/93   Pulse 84   Wt 196 lb 6.4 oz (89.1 kg)   SpO2 99%   BMI 32.68 kg/m?  ? ?General: NAD, pleasant, able to participate in exam ?Cardiac: RRR, no murmurs auscultated. ?Respiratory: CTAB, normal effort, gross expiratory wheezing appreciated while patient is supine ?Abdomen: soft, non-tender, non-distended, normoactive bowel sounds, high riding iliac crests ?Skin: warm and dry, no rashes noted ? ?ASSESSMENT/PLAN:  ?Asthma ?No sign of sick symptoms and lung exam unremarkable although patient has difficulty following directions for appropriate inspiration and expiration.  Given history, appears exacerbated by allergies.  Return precautions discussed.  Refilled Zyrtec, albuterol, Singulair.  Prescribed Flonase and replacement of Advair given patient poorly able to use Advair due to neurologic deficits.  Return in 2 weeks if not continuing to  improve. ? ?Constipation ?Advised MiraLAX if not having frequent bowel movements.  Otherwise no concerns at this time. ? ?Meds ordered this encounter  ?Medications  ? albuterol (VENTOLIN HFA) 108 (90 Base) MCG/ACT inhaler  ?  Sig: Inhale 2 puffs into the lungs every 4 (four) hours as needed. For shortness of breath  ?  Dispense:  18 g  ?  Refill:  2  ? cetirizine (ZYRTEC) 10 MG tablet  ?  Sig: Take 1 tablet (10 mg total) by mouth at bedtime.  ?  Dispense:  30 tablet  ?  Refill:  2  ? montelukast (SINGULAIR) 10 MG tablet  ?  Sig: Take 1 tablet (10 mg total) by mouth daily.  ?  Dispense:  30 tablet  ?  Refill:  2  ? fluticasone (FLONASE) 50 MCG/ACT nasal spray  ?  Sig: Place 2 sprays into both nostrils daily.  ?  Dispense:  16 g  ?  Refill:  6  ? ?Return in about 2 weeks (around 10/01/2021), or if symptoms worsen or fail to improve. ?Shelby Mattocks, DO ?09/17/2021, 2:16 PM ?PGY-1, [facility] ? ?

## 2021-09-17 ENCOUNTER — Encounter: Payer: Self-pay | Admitting: Student

## 2021-09-17 ENCOUNTER — Ambulatory Visit (INDEPENDENT_AMBULATORY_CARE_PROVIDER_SITE_OTHER): Payer: Medicaid Other | Admitting: Student

## 2021-09-17 VITALS — BP 130/93 | HR 84 | Wt 196.4 lb

## 2021-09-17 DIAGNOSIS — J309 Allergic rhinitis, unspecified: Secondary | ICD-10-CM

## 2021-09-17 DIAGNOSIS — K5900 Constipation, unspecified: Secondary | ICD-10-CM | POA: Insufficient documentation

## 2021-09-17 DIAGNOSIS — J4520 Mild intermittent asthma, uncomplicated: Secondary | ICD-10-CM | POA: Diagnosis not present

## 2021-09-17 MED ORDER — FLUTICASONE PROPIONATE 50 MCG/ACT NA SUSP: 2.0000 | Freq: Every day | NASAL | 6 refills | Status: AC

## 2021-09-17 MED ORDER — CETIRIZINE HCL 10 MG PO TABS: 10.0000 mg | ORAL_TABLET | Freq: Every day | ORAL | 2 refills | Status: AC

## 2021-09-17 MED ORDER — ALBUTEROL SULFATE HFA 108 (90 BASE) MCG/ACT IN AERS: 2.0000 | INHALATION_SPRAY | RESPIRATORY_TRACT | 2 refills | Status: DC | PRN

## 2021-09-17 MED ORDER — MONTELUKAST SODIUM 10 MG PO TABS: 10.0000 mg | ORAL_TABLET | Freq: Every day | ORAL | 2 refills | Status: DC

## 2021-09-17 NOTE — Assessment & Plan Note (Signed)
Advised MiraLAX if not having frequent bowel movements.  Otherwise no concerns at this time. ?

## 2021-09-17 NOTE — Assessment & Plan Note (Addendum)
No sign of sick symptoms and lung exam unremarkable although patient has difficulty following directions for appropriate inspiration and expiration.  Given history, appears exacerbated by allergies.  Return precautions discussed.  Refilled Zyrtec, albuterol, Singulair.  Prescribed Flonase and replacement of Advair given patient poorly able to use Advair due to neurologic deficits.  Return in 2 weeks if not continuing to improve. ?

## 2021-09-17 NOTE — Patient Instructions (Signed)
It was great to see you today! Thank you for choosing [facility] for your primary care. Ruben Murphy was seen for asthma medication refill for exacerbation and constipation. ? ?Today we addressed: ?Asthma: I have refilled the albuterol inhaler and Singulair and additionally prescribed Flonase.  We can hold off on Advair at this time and see if he does well with the Flonase.  Additionally I have refilled Zyrtec to take daily given he is having difficulty with allergies causing his asthma exacerbation. ?Constipation: If concerned that he is having harder bowel movements, MiraLAX would be the first step.  It does take couple days to work but may benefit him if he is having this problem. ? ?You should return to our clinic Return in about 2 weeks (around 10/01/2021), or if symptoms worsen or fail to improve.. ? ?I recommend that you always bring your medications to each appointment as this makes it easy to ensure you are on the correct medications and helps [place] not miss refills when you need them. ? ?Please arrive 15 minutes before your appointment to ensure smooth check in process.  We appreciate your efforts in making this happen. ? ?Take care and seek immediate care sooner if you develop any concerns.  ? ?Thank you for allowing me to participate in your care, ?Wells Guiles, DO ?09/17/2021, 2:03 PM ?PGY-1, [place] ? ? ?

## 2022-09-30 ENCOUNTER — Telehealth: Payer: Self-pay

## 2022-09-30 NOTE — Telephone Encounter (Signed)
LVM for patient to come back to schedule apt. AS, CMA

## 2023-03-02 ENCOUNTER — Encounter: Payer: Medicaid Other | Admitting: Family Medicine

## 2023-03-02 NOTE — Progress Notes (Deleted)
    SUBJECTIVE:   CHIEF COMPLAINT / HPI:   ***  PERTINENT  PMH / PSH: Periventricular leukomalacia, Epilepsy, Microcephalus  OBJECTIVE:   There were no vitals taken for this visit.  ***  ASSESSMENT/PLAN:   Assessment & Plan  No follow-ups on file.  Celine Mans, MD [facility]

## 2023-03-10 ENCOUNTER — Encounter: Payer: Self-pay | Admitting: Family Medicine

## 2023-03-10 ENCOUNTER — Ambulatory Visit: Payer: MEDICAID | Admitting: Family Medicine

## 2023-03-10 VITALS — BP 116/79 | HR 75 | Ht 65.0 in | Wt 210.2 lb

## 2023-03-10 DIAGNOSIS — Z23 Encounter for immunization: Secondary | ICD-10-CM | POA: Diagnosis not present

## 2023-03-10 DIAGNOSIS — Z0000 Encounter for general adult medical examination without abnormal findings: Secondary | ICD-10-CM | POA: Diagnosis not present

## 2023-03-10 DIAGNOSIS — J4520 Mild intermittent asthma, uncomplicated: Secondary | ICD-10-CM | POA: Diagnosis not present

## 2023-03-10 MED ORDER — ALBUTEROL SULFATE HFA 108 (90 BASE) MCG/ACT IN AERS: 2.0000 | INHALATION_SPRAY | RESPIRATORY_TRACT | 2 refills | Status: AC | PRN

## 2023-03-10 MED ORDER — MONTELUKAST SODIUM 10 MG PO TABS: 10.0000 mg | ORAL_TABLET | Freq: Every day | ORAL | 2 refills | Status: AC

## 2023-03-10 NOTE — Patient Instructions (Signed)
It was great to see you! Thank you for allowing me to participate in your care!  Our plans for today:  - Today at your annual preventive visit we talked about the following measures:   I recommend 150 minutes of exercise per week-try 30 minutes 5 days per week We discussed reducing sugary beverages (like soda and juice) and increasing leafy greens and whole fruits.  We discussed avoiding tobacco and alcohol.  I recommend avoiding illicit substances.  Your blood pressure is 116/79 at goal of <130/80.     Please arrive 15 minutes PRIOR to your next scheduled appointment time! If you do not, this affects OTHER patients' care.  Take care and seek immediate care sooner if you develop any concerns.   Celine Mans, MD, PGY-2 [facility] 11:14 AM 03/10/2023  [facility]

## 2023-03-10 NOTE — Progress Notes (Signed)
    SUBJECTIVE:   Chief compliant/HPI: annual examination  Ruben Murphy is a 27 y.o. who presents today for an annual exam.   Brought in by mother today for physical. Occasionally has left sided abdominal. Occurs twice a month. Goes away on own.   Reports uses albuterol as needed and 1 other inhaler at home daily.  Does not remember the name of this medication.  Reviewed and updated history.   Review of systems reviewed.   OBJECTIVE:   BP 116/79   Pulse 75   Ht 5\' 5"  (1.651 m)   Wt 210 lb 4 oz (95.4 kg)   SpO2 100%   BMI 34.99 kg/m   General: NAD HEENT: poor dentition, MMM, gait slow Neuro: A&O, PERRL Cardiovascular: RRR, no murmurs, no peripheral edema Respiratory: normal WOB on RA, CTAB, no wheezes, ronchi or rales Abdomen: soft, NTTP, no rebound or guarding Extremities: Moving all 4 extremities equally   ASSESSMENT/PLAN:   Assessment & Plan Annual physical exam See AVS for age appropriate recommendations  PHQ score 3, reviewed and discussed.  Blood pressure reviewed and at goal.      Considered the following items based upon USPSTF recommendations: HIV testing: declined Hepatitis C: declined Hepatitis B: declined Syphilis if at high risk: declined GC/CTdeclined Lipid panel (nonfasting or fasting) discussed based upon AHA recommendations and not ordered.  Consider repeat every 4-6 years.  Reviewed risk factors for latent tuberculosis and not indicated Immunizations: Influenza and Tdap administered.   Follow up in 1 year or sooner if indicated.  Encounter for immunization Tdap and influenza vaccine administered today. Intermittent asthma without complication, unspecified asthma severity Refilled albuterol.  Mother patient will look at inhalers at home and call clinic to report what he has additionally been taking.  Will refill as indicated at that time.   Celine Mans, MD [facility]

## 2023-03-10 NOTE — Assessment & Plan Note (Signed)
Refilled albuterol.  Mother patient will look at inhalers at home and call clinic to report what he has additionally been taking.  Will refill as indicated at that time.

## 2023-03-28 IMAGING — CR DG ANKLE COMPLETE 3+V*L*
3 series · 3 of 3 positions shown · non-contrast
Comparison: 08/06/2019

CLINICAL DATA: Multiple falls

EXAM:
LEFT ANKLE COMPLETE - 3+ VIEW

[x ankle lat left]
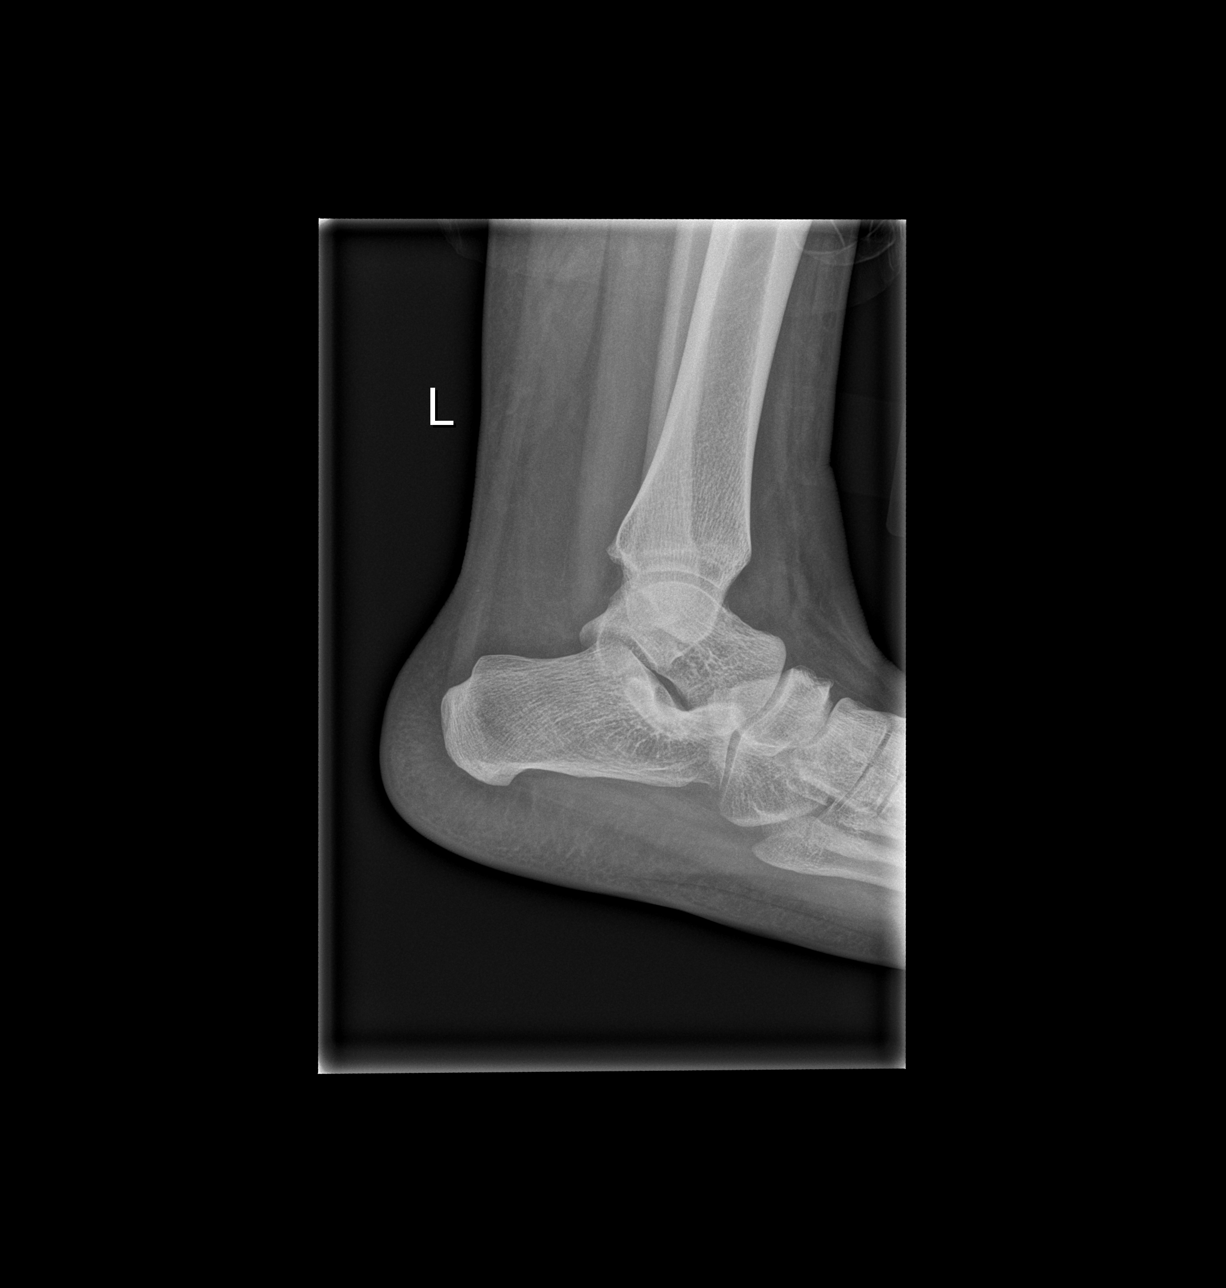

[x ankle ap left]
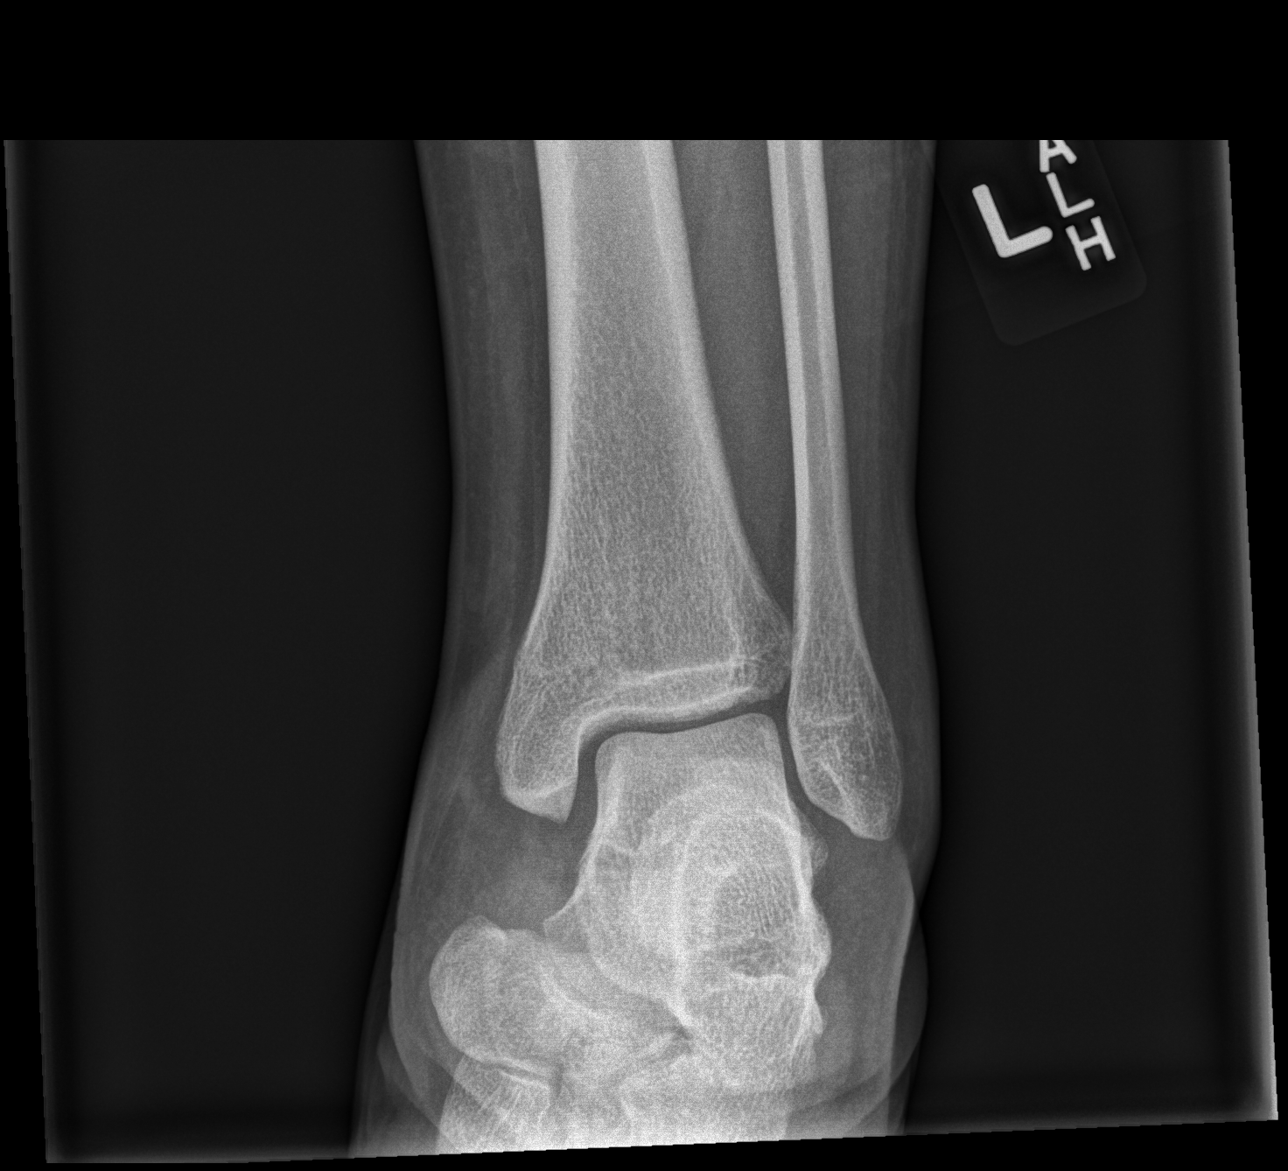

[x ankle obl left]
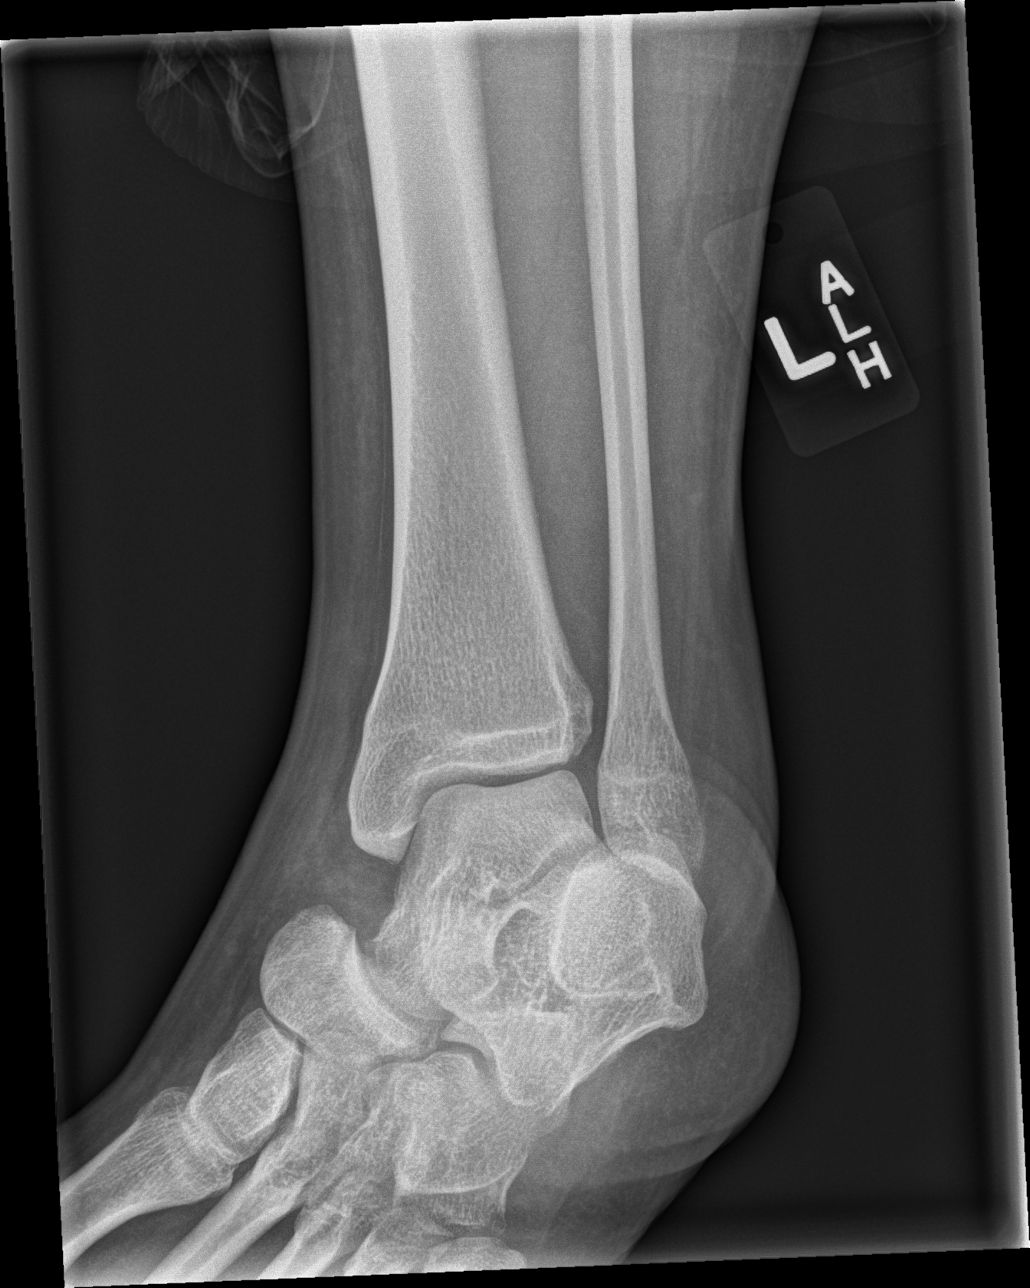

[3 of 3 positions shown; findings below may reference images not displayed]

FINDINGS: There is no evidence of fracture, dislocation, or joint effusion.
There is no evidence of arthropathy or other focal bone abnormality.
Soft tissues are unremarkable.
IMPRESSION: Negative.

## 2023-11-15 ENCOUNTER — Ambulatory Visit (HOSPITAL_COMMUNITY): Payer: MEDICAID | Admitting: Licensed Clinical Social Worker

## 2023-11-15 DIAGNOSIS — F79 Unspecified intellectual disabilities: Secondary | ICD-10-CM | POA: Insufficient documentation

## 2023-11-15 DIAGNOSIS — Z008 Encounter for other general examination: Secondary | ICD-10-CM | POA: Insufficient documentation

## 2023-11-15 DIAGNOSIS — F840 Autistic disorder: Secondary | ICD-10-CM | POA: Insufficient documentation

## 2023-11-15 NOTE — Progress Notes (Signed)
 Comprehensive Clinical Assessment (CCA) Note  11/15/2023 Ruben Murphy [id number]  Chief Complaint:  Chief Complaint  Patient presents with   Psych assessment for waiver program    Visit Diagnosis: Autism, intelectual disability, and psychological assessment     Client is a 28 year old male. Client is referred by [facility] for a Psychological assessment.   Client states mental health symptoms as evidenced by:   Depression None None  Mania None None  Anxiety None None  Psychosis None None  Trauma None None  Obsessions None None  Compulsions None None  Inattention None None  Hyperactivity/Impulsivity None None  Oppositional/Defiant Behaviors None None  Emotional Irregularity None None     Client denies suicidal and homicidal ideations at this time  Client denies hallucinations and delusions at this time   Assessment Information that integrates subjective and objective details with a therapist's professional interpretation:     Ruben Murphy today was pleasant, cooperative, and maintained poor eye contact throughout session as evidenced by being distracted by cell phone and looking around the room.  He presented today with his sister for psychological evaluation.  Ruben Murphy was referred here by [facility] due to needing psychological assessment to 4-day program wavers.  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  Patient presented today with cochlear implant.  LCSW inquired about sign language interpreter but sister reports that he knows very little sign language other than a few things.  Patient and sister were agreeable to psychological assessment for day program through an initiative waiver.  No mental health symptoms were reported at this time.  Sister reports some irritability when he does not to listen and will not make more instructions.  Per sister patient has very limited verbal outbursts and no physical outbursts to date.  Most days are spent waking up around 10 or 11 AM, then  patient will eat breakfast, and patient will either exercise or spend time with his grandfather.  Ruben Murphy has good support system through mother, sister, and grandparents.  His legal guardian is his mother.  Patient's highest level of education is high school.  Per assessment LCSW notes no barriers to admission into a day program based on information that was reported.  LCSW did refer patient for psychiatric evaluation for further diagnostic assessment.   Client states use of the following substances: None reported      Client was in agreement with treatment recommendations.    CCA Screening, Triage and Referral (STR)  Patient Reported Information  Referral name: [facility]   Whom do you see for routine medical problems? Primary Care  Practice/Facility Name: Ivin Marrow, MD  Family Medicine  How Long Has This Been Causing You Problems? > than 6 months  What Do You Feel Would Help You the Most Today? Treatment for Depression or other mood problem   Have You Recently Been in Any Inpatient Treatment (Hospital/Detox/Crisis Center/28-Day Program)? No  Have You Ever Received Services From [employer] Before? No  Have You Recently Had Any Thoughts About Hurting Yourself? No  Are You Planning to Commit Suicide/Harm Yourself At This time? No  Have you Recently Had Thoughts About Hurting Someone Marigene Shoulder? No  Have You Used Any Alcohol or Drugs in the Past 24 Hours? No  Do You Currently Have a Therapist/Psychiatrist? No  Have You Been Recently Discharged From Any Office Practice or Programs? No  CCA Screening Triage Referral Assessment Type of Contact: Face-to-Face  Is CPS involved or ever been involved? Never  Is APS involved  or ever been involved? Never  Patient Determined To Be At Risk for Harm To Self or Others Based on Review of Patient Reported Information or Presenting Complaint? No  Method: No Plan  Availability of Means: No access or NA  Intent: Vague intent or  NA  Notification Required: No need or identified person  Are There Guns or Other Weapons in Your Home? No  Are These Weapons Safely Secured?                            No  Location of Assessment: [facility]  [place] of Residence: [place]  Options For Referral: DD/IDD Facility  CCA Biopsychosocial Intake/Chief Complaint:  Needs Psych assessment for adult day services  Current Symptoms/Problems: Irritability when he does not want to listen, but no other syptoms reports  Patient Reported Schizophrenia/Schizoaffective Diagnosis in Past: No  Strengths: family care  Preferences: none reported  Abilities: none reported  Type of Services Patient Feels are Needed: Adult day program  Initial Clinical Notes/Concerns: none  Mental Health Symptoms Depression:  None   Duration of Depressive symptoms: No data recorded  Mania:  None   Anxiety:   None   Psychosis:  None   Duration of Psychotic symptoms: No data recorded  Trauma:  None   Obsessions:  None   Compulsions:  None   Inattention:  None   Hyperactivity/Impulsivity:  None   Oppositional/Defiant Behaviors:  None   Emotional Irregularity:  None   Other Mood/Personality Symptoms:  No data recorded   Mental Status Exam Appearance and self-care  Stature:  Average   Weight:  Overweight   Clothing:  Casual   Grooming:  Normal   Cosmetic use:  No data recorded  Posture/gait:  Normal   Motor activity:  Not Remarkable   Sensorium  Attention:  Distractible; Confused; Unaware   Concentration:  Scattered   Orientation:  Object; Person   Recall/memory:  Defective in Short-term; Defective in Recent; Defective in Remote   Affect and Mood  Affect:  Not Congruent   Mood:  Euthymic; Euphoric   Relating  Eye contact:  Fleeting; Avoided   Facial expression:  Responsive   Attitude toward examiner:  Guarded   Thought and Language  Speech flow: Mute   Thought content:  No data recorded   Preoccupation:  None   Hallucinations:  None   Organization:  No data recorded  [employer] of Knowledge:  Poor   Intelligence:  Below average   Abstraction:  Abstract; Overly abstract   Judgement:  Common-sensical   Reality Testing:  Unaware   Insight:  Lacking; Uses connections   Decision Making:  Only simple   Social Functioning  Social Maturity:  Isolates   Social Judgement:  No data recorded  Stress  Stressors:  Other (Comment) ([place] from lack of connection to programs)   Coping Ability:  Resilient   Skill Deficits:  Activities of daily living; Self-care; Intellect/education   Supports:  Friends/Service system; Support needed     Religion: Religion/Spirituality Are You A Religious Person?: No  Leisure/Recreation: Leisure / Recreation Do You Have Hobbies?: Yes Leisure and Hobbies: hanging out with grandpa, family, and playing basketball  Exercise/Diet: Exercise/Diet Do You Exercise?: Yes What Type of Exercise Do You Do?: Other (Comment) (play basketball and walking) How Many Times a Week Do You Exercise?: 4-5 times a week Have You Gained or Lost A Significant Amount of Weight in  the Past Six Months?: No Do You Follow a Special Diet?: No Do You Have Any Trouble Sleeping?: No   CCA Employment/Education Employment/Work Situation: Employment / Work Situation Employment Situation: On disability Why is Patient on Disability: autism and I/DD How Long has Patient Been on Disability: multiple years Patient's Job has Been Impacted by Current Illness: No Has Patient ever Been in the [employer]?: No  Education: Education Is Patient Currently Attending School?: No Last Grade Completed: 12 Did Garment/textile technologist From [employer]?: Yes Did You Attend College?: No Did You Attend Graduate School?: No Did You Have An Individualized Education Program (IIEP): Yes Did You Have Any Difficulty At School?: Yes Were Any Medications Ever Prescribed For  These Difficulties?: No Patient's Education Has Been Impacted by Current Illness: Yes How Does Current Illness Impact Education?: special education classrooms and delay in graduation until he was 28 years old   CCA Family/Childhood History Family and Relationship History: Family history Marital status: Single Are you sexually active?: No Does patient have children?: No  Childhood History:  Childhood History By whom was/is the patient raised?: Mother, Grandparents Description of patient's relationship with caregiver when they were a child: Good with all family Patient's description of current relationship with people who raised him/her: Good with all family Does patient have siblings?: Yes Number of Siblings: 2 Description of patient's current relationship with siblings: two younger sisters Did patient suffer any verbal/emotional/physical/sexual abuse as a child?: No Did patient suffer from severe childhood neglect?: No Has patient ever been sexually abused/assaulted/raped as an adolescent or adult?: No Was the patient ever a victim of a crime or a disaster?: No Witnessed domestic violence?: No Has patient been affected by domestic violence as an adult?: No  Child/Adolescent Assessment:     CCA Substance Use Alcohol/Drug Use:  DSM5 Diagnoses: Patient Active Problem List   Diagnosis Date Noted   Psychological assessment 11/15/2023   Constipation 09/17/2021   Abnormal gait 09/08/2020   Allergic rhinitis 02/27/2020   Asthma 05/01/2013   Sensory hearing loss, 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   Transient alteration of awareness 01/04/2013   Morbid obesity (HCC) 01/04/2013   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   Delayed milestones 01/04/2013   Other specified forms of hearing loss 01/04/2013   Microcephalus (HCC) 01/04/2013   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 Patient/Guardian was advised if they have not already done so to contact the registration department to sign all necessary forms in order for us  to release information regarding their care.   Consent: Patient/Guardian gives verbal consent for treatment and assignment of benefits for services provided during this visit. Patient/Guardian expressed understanding and agreed to proceed.   Akon Reinoso S [employer], LCSW

## 2023-12-18 ENCOUNTER — Ambulatory Visit (HOSPITAL_COMMUNITY): Payer: MEDICAID | Admitting: Psychiatry

## 2023-12-19 NOTE — Progress Notes (Unsigned)
 This encounter was created in error - please disregard.

## 2023-12-20 ENCOUNTER — Encounter (HOSPITAL_COMMUNITY): Payer: MEDICAID | Admitting: Psychiatry

## 2023-12-20 ENCOUNTER — Telehealth (HOSPITAL_COMMUNITY): Payer: Self-pay | Admitting: Psychiatry

## 2023-12-20 NOTE — Telephone Encounter (Signed)
 Patient was scheduled for initial psychiatric evaluation appointment with this writer 12/20/23. Patient/guardian did not join for visit; called number in chart. Mom/guardian, Ruben Murphy answered, stating she had attempted to call clinic to cancel appointment but was unable to reach anyone. States she just had dental procedure performed this morning and currently not in best state to conduct appointment.   She expresses confusion over purpose of appointment as well, stating she is seeking clearance for day program. CCA was performed by Ruben Patee LCSW 11/15/23 and she was not sure why patient was referred for psychiatric evaluation. Per LCSW note: Per assessment LCSW notes no barriers to admission into a day program based on information that was reported.  LCSW did refer patient for psychiatric evaluation for further diagnostic assessment. Discussed that she can have CCA sent to day program for review (provided with information for medical records), however they may want patient to have psychiatric evaluation completed given that was the recommendation from LCSW. She would appreciate call from front desk to get rescheduled for this. No other questions/concerns at this time.  Ruben DELENA PUMMEL, MD 12/20/23

## 2024-04-22 ENCOUNTER — Ambulatory Visit: Payer: MEDICAID | Admitting: Family Medicine

## 2024-04-22 NOTE — Progress Notes (Deleted)
   SUBJECTIVE:   CHIEF COMPLAINT / HPI:  Ruben Murphy is a 28 y.o. male with a pertinent past medical history of epilepsy, developmental delay, congenital syphilis, autism, and asthma presenting to the clinic for chronic condition follow up and medication refills.  Asthma   PERTINENT PMH / PSH: ***  *Remainder reviewed in problem list.   OBJECTIVE:   There were no vitals taken for this visit. Physical Exam General: Age-appropriate, resting comfortably in chair, NAD, alert and at baseline. HEENT:  Head: Normocephalic, atraumatic. No tenderness to percussion over sinuses. Eyes: PERRLA. No conjunctival erythema or scleral injections. Ears: TMs non-bulging and non-erythematous bilaterally. No erythema of external ear canal. No cerumen impaction. Nose: Non-erythematous turbinates. No rhinorrhea. Mouth/Oral: Clear, no tonsillar exudate. MMM. Neck: Supple. No LAD. Cardiovascular: Regular rate and rhythm. Normal S1/S2. No murmurs, rubs, or gallops appreciated. 2+ radial pulses. Pulmonary: Clear bilaterally to ascultation. No wheezes, crackles, or rhonchi. Normal WOB on room air. No accessory muscle use. Abdominal: No tenderness to deep or light palpation. No rebound or guarding. No HSM. Skin: Warm and dry. Extremities: No peripheral edema bilaterally. Capillary refill <2 seconds.  No results found for this or any previous visit (from the past 48 hours).     11/15/2023    1:18 PM  Depression screen PHQ 2/9  Decreased Interest   Down, Depressed, Hopeless   PHQ - 2 Score      Information is confidential and restricted. Go to Review Flowsheets to unlock data.     ASSESSMENT/PLAN:   Assessment & Plan   No follow-ups on file.  Jakeem Grape Toma, MD [facility]
